# Patient Record
Sex: Female | Born: 1990 | Race: Black or African American | Hispanic: No | Marital: Single | Smoking: Never smoker
Health system: Southern US, Community
[De-identification: ages and names within clinical notes are randomized; demographics above are authoritative.]

## PROBLEM LIST (undated history)

## (undated) DIAGNOSIS — E559 Vitamin D deficiency, unspecified: Secondary | ICD-10-CM

## (undated) DIAGNOSIS — N83201 Unspecified ovarian cyst, right side: Secondary | ICD-10-CM

## (undated) HISTORY — PX: NO PAST SURGERIES: SHX2092

## (undated) HISTORY — DX: Vitamin D deficiency, unspecified: E55.9

## (undated) HISTORY — DX: Unspecified ovarian cyst, right side: N83.201

---

## 2019-02-12 ENCOUNTER — Ambulatory Visit: Payer: Managed Care, Other (non HMO) | Attending: Internal Medicine

## 2019-02-12 DIAGNOSIS — Z20822 Contact with and (suspected) exposure to covid-19: Secondary | ICD-10-CM

## 2019-02-13 ENCOUNTER — Other Ambulatory Visit: Payer: Self-pay

## 2019-02-13 LAB — NOVEL CORONAVIRUS, NAA: SARS-CoV-2, NAA: NOT DETECTED

## 2019-02-22 ENCOUNTER — Ambulatory Visit: Payer: Managed Care, Other (non HMO) | Attending: Internal Medicine

## 2019-02-22 DIAGNOSIS — Z20822 Contact with and (suspected) exposure to covid-19: Secondary | ICD-10-CM

## 2019-02-24 LAB — NOVEL CORONAVIRUS, NAA: SARS-CoV-2, NAA: NOT DETECTED

## 2019-10-08 ENCOUNTER — Other Ambulatory Visit: Payer: Managed Care, Other (non HMO)

## 2020-07-03 ENCOUNTER — Other Ambulatory Visit: Payer: Self-pay

## 2020-07-03 ENCOUNTER — Emergency Department (HOSPITAL_BASED_OUTPATIENT_CLINIC_OR_DEPARTMENT_OTHER)
Admission: EM | Admit: 2020-07-03 | Discharge: 2020-07-03 | Disposition: A | Discharge status: Home / Self Care | Payer: BLUE CROSS/BLUE SHIELD | Attending: Emergency Medicine | Admitting: Emergency Medicine

## 2020-07-03 ENCOUNTER — Encounter (HOSPITAL_BASED_OUTPATIENT_CLINIC_OR_DEPARTMENT_OTHER): Payer: Self-pay

## 2020-07-03 ENCOUNTER — Emergency Department (HOSPITAL_BASED_OUTPATIENT_CLINIC_OR_DEPARTMENT_OTHER): Payer: BLUE CROSS/BLUE SHIELD

## 2020-07-03 DIAGNOSIS — R102 Pelvic and perineal pain: Secondary | ICD-10-CM

## 2020-07-03 DIAGNOSIS — N83201 Unspecified ovarian cyst, right side: Secondary | ICD-10-CM | POA: Diagnosis not present

## 2020-07-03 DIAGNOSIS — R1030 Lower abdominal pain, unspecified: Secondary | ICD-10-CM

## 2020-07-03 DIAGNOSIS — N83202 Unspecified ovarian cyst, left side: Secondary | ICD-10-CM | POA: Insufficient documentation

## 2020-07-03 LAB — CBC WITH DIFFERENTIAL/PLATELET
Abs Immature Granulocytes: 0.02 10*3/uL (ref 0.00–0.07)
Basophils Absolute: 0 10*3/uL (ref 0.0–0.1)
Basophils Relative: 0 %
Eosinophils Absolute: 0.1 10*3/uL (ref 0.0–0.5)
Eosinophils Relative: 1 %
HCT: 38 % (ref 36.0–46.0)
Hemoglobin: 12.7 g/dL (ref 12.0–15.0)
Immature Granulocytes: 0 %
Lymphocytes Relative: 31 %
Lymphs Abs: 2.2 10*3/uL (ref 0.7–4.0)
MCH: 30 pg (ref 26.0–34.0)
MCHC: 33.4 g/dL (ref 30.0–36.0)
MCV: 89.6 fL (ref 80.0–100.0)
Monocytes Absolute: 0.3 10*3/uL (ref 0.1–1.0)
Monocytes Relative: 4 %
Neutro Abs: 4.5 10*3/uL (ref 1.7–7.7)
Neutrophils Relative %: 64 %
Platelets: 242 10*3/uL (ref 150–400)
RBC: 4.24 MIL/uL (ref 3.87–5.11)
RDW: 12.4 % (ref 11.5–15.5)
WBC: 7.1 10*3/uL (ref 4.0–10.5)
nRBC: 0 % (ref 0.0–0.2)

## 2020-07-03 LAB — URINALYSIS, ROUTINE W REFLEX MICROSCOPIC
Bilirubin Urine: NEGATIVE
Glucose, UA: NEGATIVE mg/dL
Hgb urine dipstick: NEGATIVE
Ketones, ur: NEGATIVE mg/dL
Leukocytes,Ua: NEGATIVE
Nitrite: NEGATIVE
Protein, ur: NEGATIVE mg/dL
Specific Gravity, Urine: 1.01 (ref 1.005–1.030)
pH: 7 (ref 5.0–8.0)

## 2020-07-03 LAB — COMPREHENSIVE METABOLIC PANEL
ALT: 9 U/L (ref 0–44)
AST: 19 U/L (ref 15–41)
Albumin: 4.4 g/dL (ref 3.5–5.0)
Alkaline Phosphatase: 34 U/L — ABNORMAL LOW (ref 38–126)
Anion gap: 9 (ref 5–15)
BUN: 9 mg/dL (ref 6–20)
CO2: 24 mmol/L (ref 22–32)
Calcium: 8.9 mg/dL (ref 8.9–10.3)
Chloride: 104 mmol/L (ref 98–111)
Creatinine, Ser: 0.61 mg/dL (ref 0.44–1.00)
GFR, Estimated: >60 mL/min (ref >60)
Glucose, Bld: 103 mg/dL — ABNORMAL HIGH (ref 70–99)
Potassium: 3.6 mmol/L (ref 3.5–5.1)
Sodium: 137 mmol/L (ref 135–145)
Total Bilirubin: 1.5 mg/dL — ABNORMAL HIGH (ref 0.3–1.2)
Total Protein: 8.2 g/dL — ABNORMAL HIGH (ref 6.5–8.1)

## 2020-07-03 LAB — PREGNANCY, URINE: Preg Test, Ur: NEGATIVE

## 2020-07-03 MED ORDER — NAPROXEN 500 MG PO TABS: 500.0000 mg | ORAL_TABLET | Freq: Two times a day (BID) | ORAL | 0 refills | Status: DC

## 2020-07-03 MED ORDER — KETOROLAC TROMETHAMINE 30 MG/ML IJ SOLN
30.0000 mg | Freq: Once | INTRAMUSCULAR | Status: AC
  Administered 2020-07-03: 30 mg via INTRAVENOUS
  Filled 2020-07-03: qty 1

## 2020-07-03 NOTE — ED Provider Notes (Signed)
[facility] EMERGENCY DEPARTMENT Provider Note   CSN: [id number] Arrival date & time: 07/03/20  1417     History Chief Complaint  Patient presents with  . Abdominal Pain    Monique Myers is a 30 y.o. female.  Patient presents emergency department for evaluation of intermittent sharp right lower and middle lower abdominal pains.  Symptoms started about 1 AM.  Patient applied a heating pad and then fell asleep.  Pain was gone when she woke up and she went to work.  Pain returned.  She states that she is expecting her period to start.  Pain will last approximately 10 to 15 minutes during these episodes and then improved.  No associated nausea, vomiting, diarrhea.  No constipation. No hematuria or irritative UTI symptoms including dysuria, increased frequency or urgency.  No vaginal discharge but did just noted a small amount of spotting when she went to provide a urine sample here.  She is not currently sexually active (last encounter > 1 year ago) and is not concerned about pregnancy or sexually transmitted infection.  No history of abdominal surgeries.  No other treatments prior to arrival.        History reviewed. No pertinent past medical history.  There are no problems to display for this patient.   History reviewed. No pertinent surgical history.   OB History   No obstetric history on file.     History reviewed. No pertinent family history.  Social History   Tobacco Use  . Smoking status: Never Smoker  . Smokeless tobacco: Never Used  Vaping Use  . Vaping Use: Never used  Substance Use Topics  . Alcohol use: Yes    Comment: ocassional  . Drug use: Never    Home Medications Prior to Admission medications   Medication Sig Start Date End Date Taking? Authorizing Provider  cetirizine (ZYRTEC) 10 MG tablet Take by mouth.    [provider]    Allergies    Other  Review of Systems   Review of Systems  Constitutional: Negative for fever.   HENT: Negative for rhinorrhea and sore throat.   Eyes: Negative for redness.  Respiratory: Negative for cough.   Cardiovascular: Negative for chest pain.  Gastrointestinal: Positive for abdominal pain. Negative for diarrhea, nausea and vomiting.  Genitourinary: Positive for vaginal bleeding (spotting). Negative for dysuria, frequency, hematuria, urgency and vaginal discharge.  Musculoskeletal: Negative for myalgias.  Skin: Negative for rash.  Neurological: Negative for headaches.    Physical Exam Updated Vital Signs BP (!) 147/106 (BP Location: Right Arm)   Pulse 74   Temp 98.8 F (37.1 C) (Oral)   Resp 18   Ht 5\' 4"  (1.626 m)   Wt 63.5 kg   LMP 06/05/2020   SpO2 100%   BMI 24.03 kg/m   Physical Exam Vitals and nursing note reviewed.  Constitutional:      General: She is not in acute distress.    Appearance: She is well-developed.  HENT:     Head: Normocephalic and atraumatic.     Right Ear: External ear normal.     Left Ear: External ear normal.     Nose: Nose normal.  Eyes:     Conjunctiva/sclera: Conjunctivae normal.  Cardiovascular:     Rate and Rhythm: Normal rate and regular rhythm.     Heart sounds: No murmur heard.   Pulmonary:     Effort: No respiratory distress.     Breath sounds: No wheezing, rhonchi or rales.  Abdominal:     Palpations: Abdomen is soft.     Tenderness: There is abdominal tenderness in the right lower quadrant and suprapubic area. There is no guarding or rebound.     Comments: On palpation, there is minimal suprapubic and right lower quadrant tenderness reported.  No rebound or guarding.  Musculoskeletal:     Cervical back: Normal range of motion and neck supple.     Right lower leg: No edema.     Left lower leg: No edema.  Skin:    General: Skin is warm and dry.     Findings: No rash.  Neurological:     General: No focal deficit present.     Mental Status: She is alert. Mental status is at baseline.     Motor: No weakness.   Psychiatric:        Mood and Affect: Mood normal.     ED Results / Procedures / Treatments   Labs (all labs ordered are listed, but only abnormal results are displayed) Labs Reviewed  COMPREHENSIVE METABOLIC PANEL - Abnormal; Notable for the following components:      Result Value   Glucose, Bld 103 (*)    Total Protein 8.2 (*)    Alkaline Phosphatase 34 (*)    Total Bilirubin 1.5 (*)    All other components within normal limits  URINALYSIS, ROUTINE W REFLEX MICROSCOPIC  PREGNANCY, URINE  CBC WITH DIFFERENTIAL/PLATELET    EKG None  Radiology US PELVIC COMPLETE WITH TRANSVAGINAL  Result Date: 07/03/2020 CLINICAL DATA:  Pelvic pain and cramping EXAM: TRANSABDOMINAL AND TRANSVAGINAL ULTRASOUND OF PELVIS TECHNIQUE: Both transabdominal and transvaginal ultrasound examinations of the pelvis were performed. Transabdominal technique was performed for global imaging of the pelvis including uterus, ovaries, adnexal regions, and pelvic cul-de-sac. It was necessary to proceed with endovaginal exam following the transabdominal exam to visualize the endometrium and ovaries. COMPARISON:  None FINDINGS: Uterus Measurements: 7.4 x 4.0 x 4.5 cm = volume: 69.7 mL. No fibroids or other mass visualized. Endometrium Thickness: 9 mm.  No focal abnormality visualized. Right ovary Measurements: 5.3 x 4.0 x 5.0 cm = volume: 55.4 mL. Contains a 4.4 x 3.8 x 4.1 cm cyst with multiple thin septations. Left ovary Measurements: 4.1 x 2.6 x 3.6 cm = volume: 20.9 mL. Contains a 3.2 x 1.9 x 2.9 cm somewhat complicated cyst. Other findings A small amount of free fluid is seen in the pelvis. IMPRESSION: 1. Bilateral complicated ovarian cysts measuring 4.4 cm on the right with multiple thin septations and 3.2 cm on the left. Recommend a follow-up ultrasound in 6-12 weeks to ensure resolution. There is a small amount of physiologic fluid in the pelvis. 2. No other abnormalities. Electronically Signed   By: Gerome Sam III  M.D   On: 07/03/2020 16:51    Procedures Procedures   Medications Ordered in ED Medications  ketorolac (TORADOL) 30 MG/ML injection 30 mg (30 mg Intravenous Given 07/03/20 1559)    ED Course  I have reviewed the triage vital signs and the nursing notes.  Pertinent labs & imaging results that were available during my care of the patient were reviewed by me and considered in my medical decision making (see chart for details).  Patient seen and examined. Work-up initiated. Symptoms minimal and non-specific at this point.  Intermittent nature makes appendicitis less likely.  History makes PID less likely, no vaginal discharge.  Will obtain basic labs and UA.  Discussed that if these tests look good, option would  be for pelvic ultrasound versus treatment and watchful waiting.  She did ask about CT imaging.  Discussed that at this point I think the risk of a CT scan would outweigh the benefit given that probability of appendicitis and other emergent, surgical pathology of abdominal pain are very low.  Vital signs reviewed and are as follows: BP (!) 147/106 (BP Location: Right Arm)   Pulse 74   Temp 98.8 F (37.1 C) (Oral)   Resp 18   Ht 5\' 4"  (1.626 m)   Wt 63.5 kg   LMP 06/05/2020   SpO2 100%   BMI 24.03 kg/m   3:56 PM patient updated on reassuring results.  She states that she has had more persistent pain since around 3:16 PM.  On reexam, when I press in the suprapubic area, she tells me that she feels more pain in the left lower quadrant now and she has pain that moves to the right lower quadrant.  Offered pelvic ultrasound, she agrees to proceed.  We will give 30 mg of Toradol IV and reassess.  She appears comfortable.  5:42 PM ultrasound shows bilateral ovarian cysts, slightly greater on the right, with small amount of free fluid in the pelvis.  I think that this is likely the cause of the patient's pain.  Discussed results at bedside with her.  Discussed that we cannot entirely rule  out other intra-abdominal etiology, but do not feel that CT imaging is indicated today.  She is in agreement.  She will follow-up with her OB/GYN regarding ovarian cyst.  Encouraged NSAIDs for pain.   The patient was urged to return to the Emergency Department immediately with worsening of current symptoms, worsening abdominal pain, persistent vomiting, blood noted in stools, fever, or any other concerns. The patient verbalized understanding.     MDM Rules/Calculators/A&P                          Patient with abdominal pain, lower abd, non-focal. Vitals are stable, no fever. Labs overall reassuring with normal WBC. Imaging 06/07/2020 with bilateral ovarian cysts. No signs of dehydration, patient is tolerating PO's. Lungs are clear and no signs suggestive of PNA. Low concern for appendicitis, cholecystitis, pancreatitis, ruptured viscus, UTI, kidney stone, aortic dissection, aortic aneurysm or other emergent abdominal etiology. Supportive therapy indicated with return if symptoms worsen.   Final Clinical Impression(s) / ED Diagnoses Final diagnoses:  Bilateral ovarian cysts  Lower abdominal pain    Rx / DC Orders ED Discharge Orders         Ordered    naproxen (NAPROSYN) 500 MG tablet  2 times daily        07/03/20 1743           09/02/20, PA-C 07/03/20 1747    09/02/20, MD 07/16/20 0830

## 2020-07-03 NOTE — ED Triage Notes (Signed)
Pt c/o intermittent right lower abdominal pain radiating to pelvis, sharp in nature. Denies N/V/D or urinary symptoms

## 2020-07-03 NOTE — Discharge Instructions (Signed)
Please read and follow all provided instructions.  Your diagnoses today include:  1. Bilateral ovarian cysts   2. Pelvic pain   3. Lower abdominal pain     Tests performed today include:  Blood cell counts and platelets  Kidney and liver function tests  Urine test to look for infection  A blood or urine test for pregnancy (women only)  Ultrasound - shows ovarian cysts and a small amount of fluid in the pelvis  Vital signs. See below for your results today.   Medications prescribed:   Naproxen - anti-inflammatory pain medication  Do not exceed 500mg  naproxen every 12 hours, take with food  You have been prescribed an anti-inflammatory medication or NSAID. Take with food. Take smallest effective dose for the shortest duration needed for your pain. Stop taking if you experience stomach pain or vomiting.   Take any prescribed medications only as directed.  Home care instructions:   Follow any educational materials contained in this packet.  Follow-up instructions: Please follow-up with your primary care provider in the next 7 days for further evaluation of your symptoms.    Return instructions:  SEEK IMMEDIATE MEDICAL ATTENTION IF:  The pain does not go away or becomes severe   A temperature above 101F develops   Repeated vomiting occurs (multiple episodes)   The pain becomes localized to portions of the abdomen. The right side could possibly be appendicitis. In an adult, the left lower portion of the abdomen could be colitis or diverticulitis.   Blood is being passed in stools or vomit (bright red or black tarry stools)   You develop chest pain, difficulty breathing, dizziness or fainting, or become confused, poorly responsive, or inconsolable (young children)  If you have any other emergent concerns regarding your health  Additional Information: Abdominal (belly) pain can be caused by many things. Your caregiver performed an examination and possibly ordered  blood/urine tests and imaging (CT scan, x-rays, ultrasound). Many cases can be observed and treated at home after initial evaluation in the emergency department. Even though you are being discharged home, abdominal pain can be unpredictable. Therefore, you need a repeated exam if your pain does not resolve, returns, or worsens. Most patients with abdominal pain don't have to be admitted to the hospital or have surgery, but serious problems like appendicitis and gallbladder attacks can start out as nonspecific pain. Many abdominal conditions cannot be diagnosed in one visit, so follow-up evaluations are very important.  Your vital signs today were: BP 116/88   Pulse 66   Temp 98.5 F (36.9 C) (Oral)   Resp 16   Ht 5\' 4"  (1.626 m)   Wt 63.5 kg   LMP 06/05/2020   SpO2 100%   BMI 24.03 kg/m  If your blood pressure (bp) was elevated above 135/85 this visit, please have this repeated by your doctor within one month. --------------

## 2022-02-22 NOTE — L&D Delivery Note (Addendum)
Delivery Note At 9:38 AM a viable female was delivered via  (Presentation: ROA).  APGAR: 9, 9; weight  pending.   Placenta status:  spont,  intact.  Cord:  3V with the following complications:  none.  Cord pH: n/a  Anesthesia: None Episiotomy:  None Lacerations:  Bilateral labial Suture Repair: 3.0 vicryl Est. Blood Loss (mL):  233  Mom to postpartum.  Baby to Couplet care / Skin to Skin.  Purcell Nails 01/25/2023, 10:00 AM

## 2022-07-31 IMAGING — US US PELVIS COMPLETE WITH TRANSVAGINAL
1 series · 13 of 25 positions shown · non-contrast
Comparison: None

CLINICAL DATA: Pelvic pain and cramping

EXAM:
TRANSABDOMINAL AND TRANSVAGINAL ULTRASOUND OF PELVIS
TECHNIQUE: Both transabdominal and transvaginal ultrasound examinations of the
pelvis were performed. Transabdominal technique was performed for
global imaging of the pelvis including uterus, ovaries, adnexal
regions, and pelvic cul-de-sac. It was necessary to proceed with
endovaginal exam following the transabdominal exam to visualize the
endometrium and ovaries.

[Series 1: us pelvis complete with transvaginal · 13 of 74 slices shown]
[im 1/74]
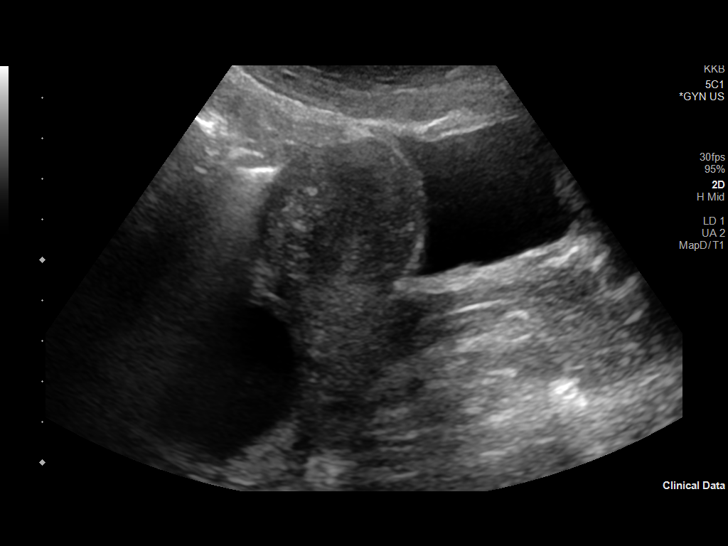
[im 7/74]
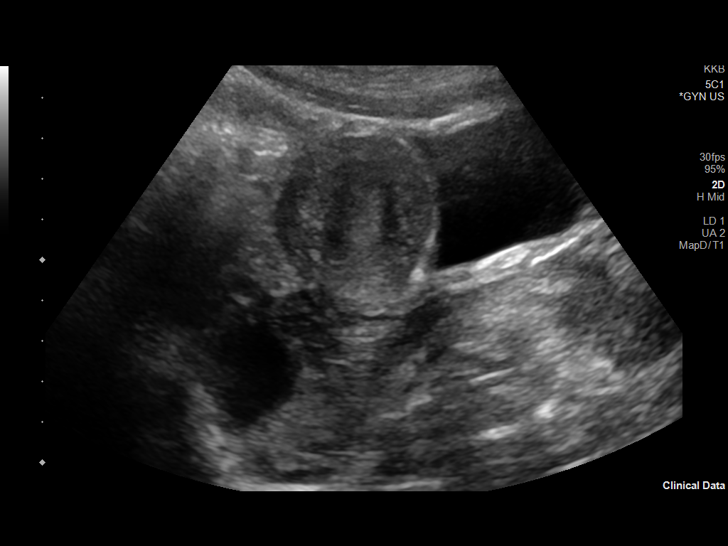
[im 13/74]
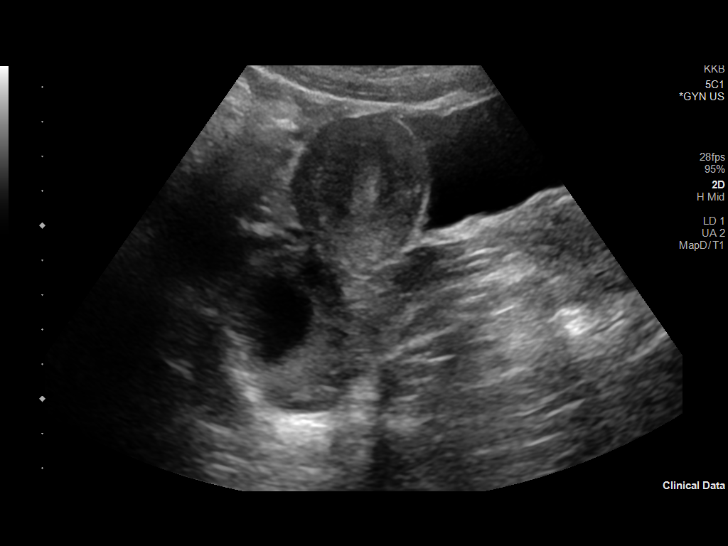
[im 19/74]
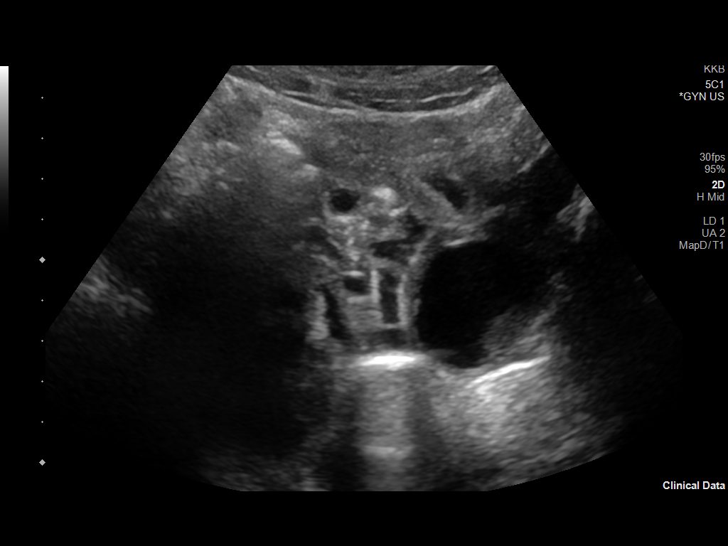
[im 25/74]
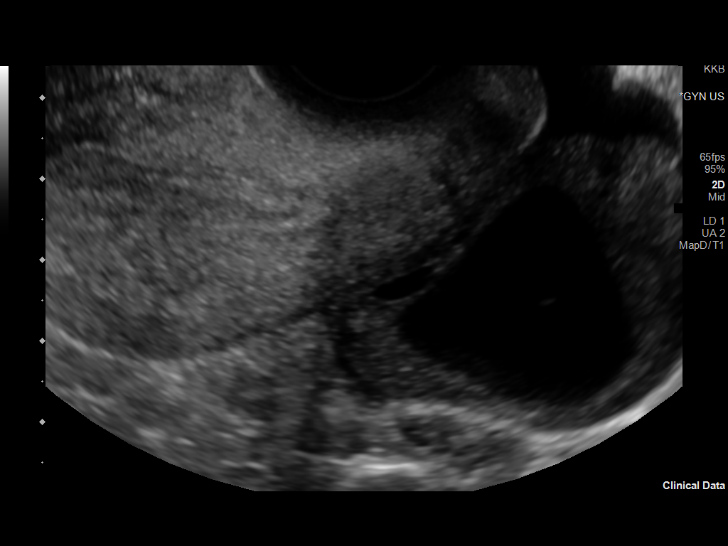
[im 31/74]
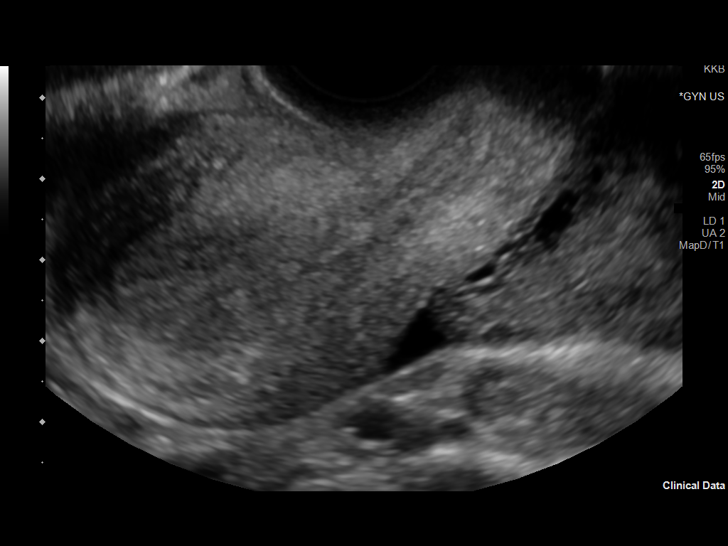
[im 37/74]
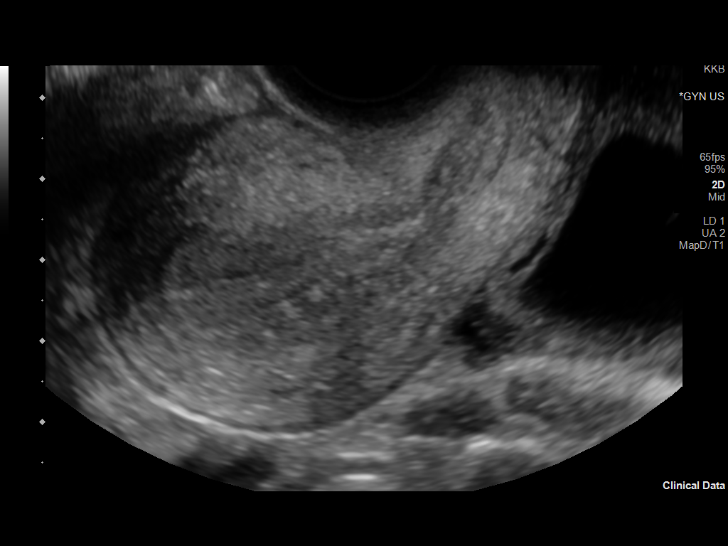
[im 43/74]
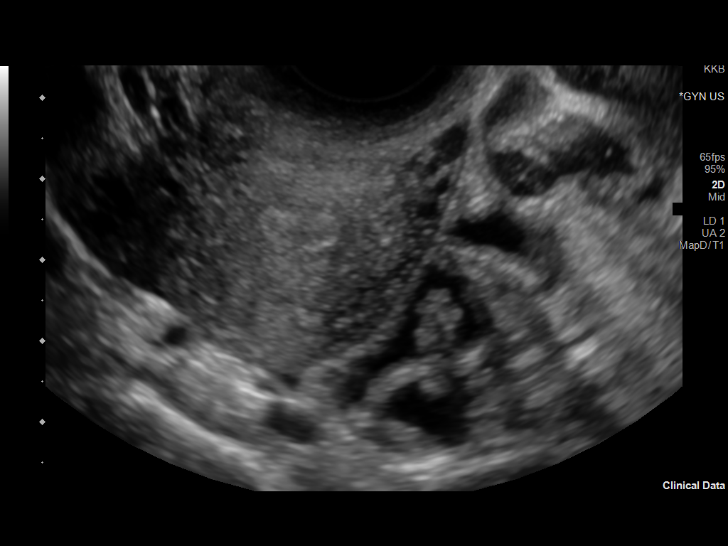
[im 49/74]
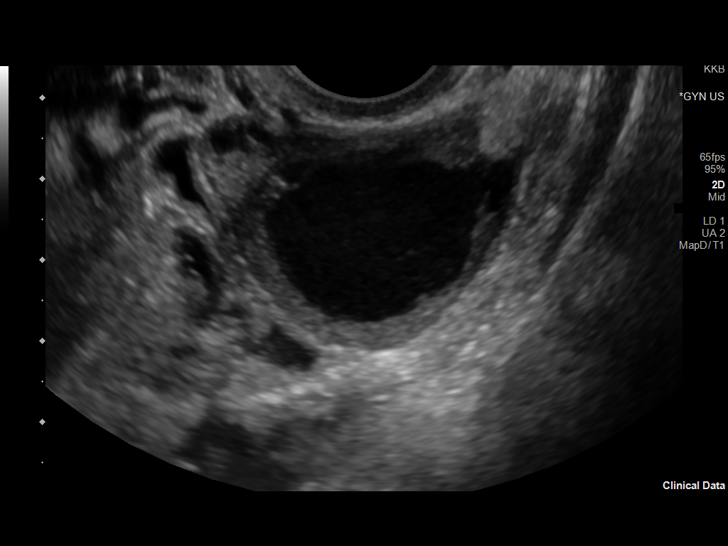
[im 55/74]
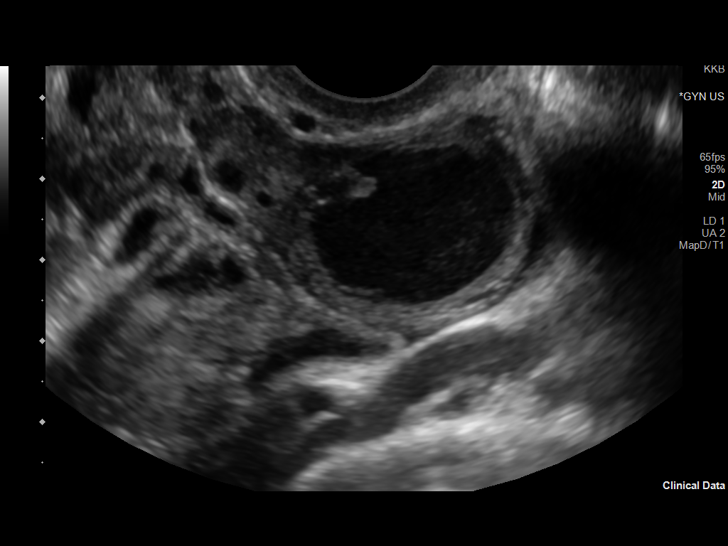
[im 61/74]
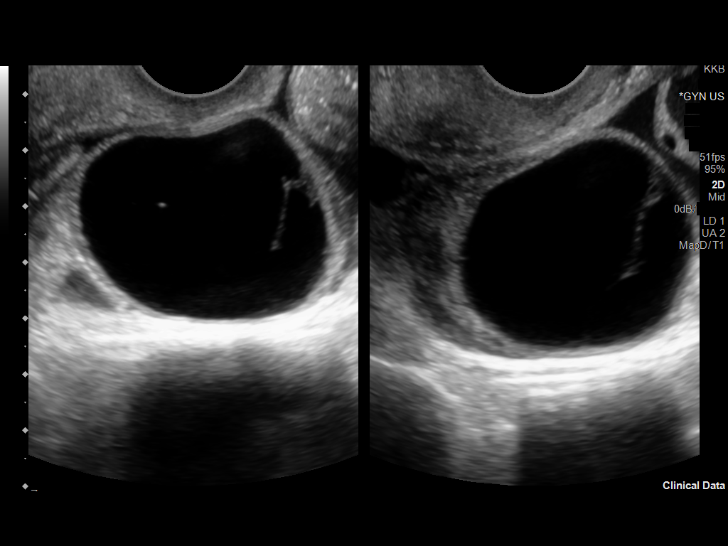
[im 67/74]
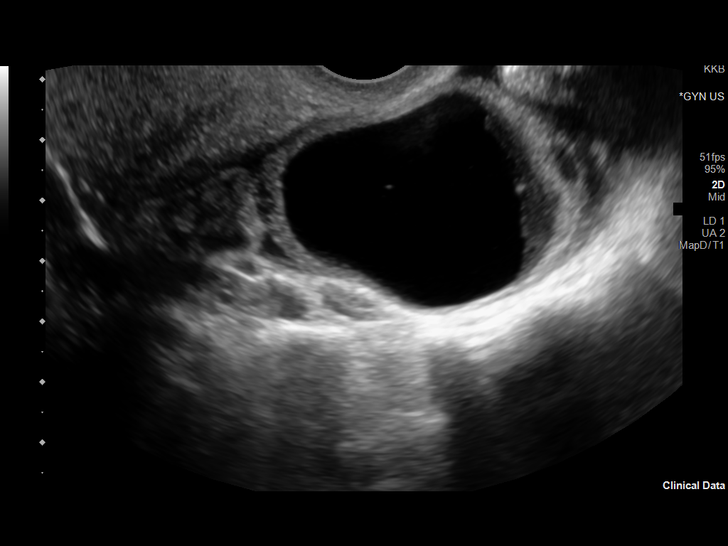
[im 74/74]
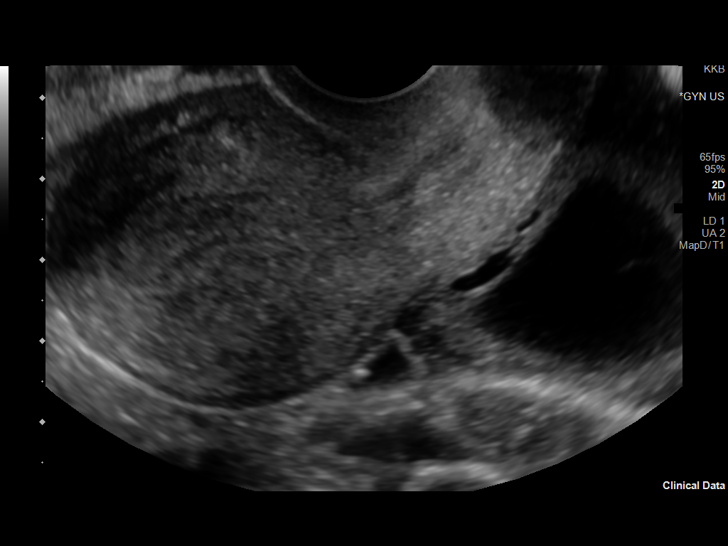

[13 of 25 positions shown; findings below may reference images not displayed]

FINDINGS: Uterus

Measurements: 7.4 x 4.0 x 4.5 cm = volume: 69.7 mL. No fibroids or
other mass visualized.

Endometrium

Thickness: 9 mm.  No focal abnormality visualized.

Right ovary

Measurements: 5.3 x 4.0 x 5.0 cm = volume: 55.4 mL. Contains a 4.4 x
3.8 x 4.1 cm cyst with multiple thin septations.

Left ovary

Measurements: 4.1 x 2.6 x 3.6 cm = volume: 20.9 mL. Contains a 3.2 x
1.9 x 2.9 cm somewhat complicated cyst.

Other findings

A small amount of free fluid is seen in the pelvis.
IMPRESSION: 1. Bilateral complicated ovarian cysts measuring 4.4 cm on the right
with multiple thin septations and 3.2 cm on the left. Recommend a
follow-up ultrasound in 6-12 weeks to ensure resolution. There is a
small amount of physiologic fluid in the pelvis.
2. No other abnormalities.

## 2022-08-25 LAB — OB RESULTS CONSOLE GC/CHLAMYDIA
Chlamydia: NEGATIVE
Neisseria Gonorrhea: NEGATIVE

## 2022-08-25 LAB — OB RESULTS CONSOLE RPR: RPR: NONREACTIVE

## 2022-08-25 LAB — OB RESULTS CONSOLE RUBELLA ANTIBODY, IGM: Rubella: IMMUNE

## 2022-08-25 LAB — OB RESULTS CONSOLE HIV ANTIBODY (ROUTINE TESTING): HIV: NONREACTIVE

## 2022-08-25 LAB — OB RESULTS CONSOLE HEPATITIS B SURFACE ANTIGEN: Hepatitis B Surface Ag: NEGATIVE

## 2022-09-06 ENCOUNTER — Other Ambulatory Visit: Payer: Self-pay | Admitting: Obstetrics and Gynecology

## 2022-09-06 DIAGNOSIS — Z363 Encounter for antenatal screening for malformations: Secondary | ICD-10-CM

## 2022-09-24 ENCOUNTER — Encounter: Payer: Self-pay | Admitting: *Deleted

## 2022-09-24 DIAGNOSIS — O0930 Supervision of pregnancy with insufficient antenatal care, unspecified trimester: Secondary | ICD-10-CM | POA: Insufficient documentation

## 2022-09-29 ENCOUNTER — Encounter: Payer: Self-pay | Admitting: *Deleted

## 2022-09-29 ENCOUNTER — Ambulatory Visit: Payer: Medicaid Other

## 2022-09-29 ENCOUNTER — Ambulatory Visit: Payer: Medicaid Other | Attending: Obstetrics and Gynecology

## 2022-09-29 VITALS — BP 110/72 | HR 78

## 2022-09-29 DIAGNOSIS — Z363 Encounter for antenatal screening for malformations: Secondary | ICD-10-CM | POA: Diagnosis present

## 2022-09-29 DIAGNOSIS — O0930 Supervision of pregnancy with insufficient antenatal care, unspecified trimester: Secondary | ICD-10-CM | POA: Diagnosis present

## 2022-11-16 LAB — HEPATITIS C ANTIBODY: HCV Ab: NEGATIVE

## 2022-11-16 LAB — OB RESULTS CONSOLE HIV ANTIBODY (ROUTINE TESTING): HIV: NONREACTIVE

## 2023-01-06 LAB — OB RESULTS CONSOLE GBS: GBS: NEGATIVE

## 2023-01-24 ENCOUNTER — Other Ambulatory Visit: Payer: Self-pay

## 2023-01-24 ENCOUNTER — Encounter (HOSPITAL_COMMUNITY): Payer: Self-pay | Admitting: Obstetrics and Gynecology

## 2023-01-24 ENCOUNTER — Inpatient Hospital Stay (HOSPITAL_COMMUNITY)
Admission: AD | Admit: 2023-01-24 | Discharge: 2023-01-27 | DRG: 806 | Disposition: A | Discharge status: Home / Self Care | Payer: Medicaid Other | Attending: Obstetrics and Gynecology | Admitting: Obstetrics and Gynecology

## 2023-01-24 DIAGNOSIS — D509 Iron deficiency anemia, unspecified: Secondary | ICD-10-CM | POA: Diagnosis present

## 2023-01-24 DIAGNOSIS — R030 Elevated blood-pressure reading, without diagnosis of hypertension: Principal | ICD-10-CM

## 2023-01-24 DIAGNOSIS — O9912 Other diseases of the blood and blood-forming organs and certain disorders involving the immune mechanism complicating childbirth: Secondary | ICD-10-CM | POA: Diagnosis present

## 2023-01-24 DIAGNOSIS — D6959 Other secondary thrombocytopenia: Secondary | ICD-10-CM | POA: Diagnosis present

## 2023-01-24 DIAGNOSIS — O26893 Other specified pregnancy related conditions, third trimester: Secondary | ICD-10-CM

## 2023-01-24 DIAGNOSIS — Z825 Family history of asthma and other chronic lower respiratory diseases: Secondary | ICD-10-CM

## 2023-01-24 DIAGNOSIS — Z3A38 38 weeks gestation of pregnancy: Secondary | ICD-10-CM | POA: Diagnosis not present

## 2023-01-24 DIAGNOSIS — O9902 Anemia complicating childbirth: Secondary | ICD-10-CM | POA: Diagnosis present

## 2023-01-24 DIAGNOSIS — O134 Gestational [pregnancy-induced] hypertension without significant proteinuria, complicating childbirth: Secondary | ICD-10-CM | POA: Diagnosis present

## 2023-01-24 DIAGNOSIS — Z8249 Family history of ischemic heart disease and other diseases of the circulatory system: Secondary | ICD-10-CM

## 2023-01-24 DIAGNOSIS — O163 Unspecified maternal hypertension, third trimester: Secondary | ICD-10-CM | POA: Diagnosis present

## 2023-01-24 DIAGNOSIS — O139 Gestational [pregnancy-induced] hypertension without significant proteinuria, unspecified trimester: Secondary | ICD-10-CM | POA: Diagnosis present

## 2023-01-24 LAB — COMPREHENSIVE METABOLIC PANEL
ALT: 15 U/L (ref 0–44)
AST: 28 U/L (ref 15–41)
Albumin: 2.7 g/dL — ABNORMAL LOW (ref 3.5–5.0)
Alkaline Phosphatase: 122 U/L (ref 38–126)
Anion gap: 8 (ref 5–15)
BUN: <5 mg/dL — ABNORMAL LOW (ref 6–20)
CO2: 21 mmol/L — ABNORMAL LOW (ref 22–32)
Calcium: 8.5 mg/dL — ABNORMAL LOW (ref 8.9–10.3)
Chloride: 108 mmol/L (ref 98–111)
Creatinine, Ser: 0.74 mg/dL (ref 0.44–1.00)
GFR, Estimated: >60 mL/min (ref >60)
Glucose, Bld: 78 mg/dL (ref 70–99)
Potassium: 3.5 mmol/L (ref 3.5–5.1)
Sodium: 137 mmol/L (ref 135–145)
Total Bilirubin: 0.7 mg/dL (ref <1.2)
Total Protein: 6.1 g/dL — ABNORMAL LOW (ref 6.5–8.1)

## 2023-01-24 LAB — CBC
HCT: 28.8 % — ABNORMAL LOW (ref 36.0–46.0)
Hemoglobin: 9.7 g/dL — ABNORMAL LOW (ref 12.0–15.0)
MCH: 30.8 pg (ref 26.0–34.0)
MCHC: 33.7 g/dL (ref 30.0–36.0)
MCV: 91.4 fL (ref 80.0–100.0)
Platelets: 132 10*3/uL — ABNORMAL LOW (ref 150–400)
RBC: 3.15 MIL/uL — ABNORMAL LOW (ref 3.87–5.11)
RDW: 12.4 % (ref 11.5–15.5)
WBC: 6.9 10*3/uL (ref 4.0–10.5)
nRBC: 0 % (ref 0.0–0.2)

## 2023-01-24 LAB — TYPE AND SCREEN
ABO/RH(D): A POS
Antibody Screen: NEGATIVE

## 2023-01-24 LAB — PROTEIN / CREATININE RATIO, URINE
Creatinine, Urine: 43 mg/dL
Total Protein, Urine: <6 mg/dL

## 2023-01-24 MED ORDER — OXYTOCIN-SODIUM CHLORIDE 30-0.9 UT/500ML-% IV SOLN
1.0000 m[IU]/min | INTRAVENOUS | Status: DC
  Administered 2023-01-24: 2 m[IU]/min via INTRAVENOUS
  Filled 2023-01-24: qty 500

## 2023-01-24 MED ORDER — LABETALOL HCL 5 MG/ML IV SOLN: 20.0000 mg | INTRAVENOUS | Status: DC | PRN

## 2023-01-24 MED ORDER — LACTATED RINGERS IV SOLN: 500.0000 mL | Freq: Once | INTRAVENOUS | Status: DC

## 2023-01-24 MED ORDER — EPHEDRINE 5 MG/ML INJ: 10.0000 mg | INTRAVENOUS | Status: DC | PRN

## 2023-01-24 MED ORDER — ONDANSETRON HCL 4 MG/2ML IJ SOLN: 4.0000 mg | Freq: Four times a day (QID) | INTRAMUSCULAR | Status: DC | PRN

## 2023-01-24 MED ORDER — OXYTOCIN BOLUS FROM INFUSION
333.0000 mL | Freq: Once | INTRAVENOUS | Status: AC
  Administered 2023-01-25: 333 mL via INTRAVENOUS

## 2023-01-24 MED ORDER — HYDRALAZINE HCL 20 MG/ML IJ SOLN: 10.0000 mg | INTRAMUSCULAR | Status: DC | PRN

## 2023-01-24 MED ORDER — FENTANYL CITRATE (PF) 100 MCG/2ML IJ SOLN
50.0000 ug | INTRAMUSCULAR | Status: DC | PRN
  Administered 2023-01-24 (×2): 100 ug via INTRAVENOUS
  Administered 2023-01-24: 50 ug via INTRAVENOUS
  Filled 2023-01-24 (×3): qty 2

## 2023-01-24 MED ORDER — PHENYLEPHRINE 80 MCG/ML (10ML) SYRINGE FOR IV PUSH (FOR BLOOD PRESSURE SUPPORT): 80.0000 ug | PREFILLED_SYRINGE | INTRAVENOUS | Status: DC | PRN

## 2023-01-24 MED ORDER — SOD CITRATE-CITRIC ACID 500-334 MG/5ML PO SOLN: 30.0000 mL | ORAL | Status: DC | PRN

## 2023-01-24 MED ORDER — FENTANYL-BUPIVACAINE-NACL 0.5-0.125-0.9 MG/250ML-% EP SOLN
12.0000 mL/h | EPIDURAL | Status: DC | PRN
  Filled 2023-01-24: qty 250

## 2023-01-24 MED ORDER — LABETALOL HCL 5 MG/ML IV SOLN: 40.0000 mg | INTRAVENOUS | Status: DC | PRN

## 2023-01-24 MED ORDER — ACETAMINOPHEN 325 MG PO TABS: 650.0000 mg | ORAL_TABLET | ORAL | Status: DC | PRN

## 2023-01-24 MED ORDER — DIPHENHYDRAMINE HCL 50 MG/ML IJ SOLN: 12.5000 mg | INTRAMUSCULAR | Status: DC | PRN

## 2023-01-24 MED ORDER — LABETALOL HCL 5 MG/ML IV SOLN: 80.0000 mg | INTRAVENOUS | Status: DC | PRN

## 2023-01-24 MED ORDER — LACTATED RINGERS IV SOLN: INTRAVENOUS | Status: DC

## 2023-01-24 MED ORDER — OXYCODONE-ACETAMINOPHEN 5-325 MG PO TABS: 2.0000 | ORAL_TABLET | ORAL | Status: DC | PRN

## 2023-01-24 MED ORDER — OXYCODONE-ACETAMINOPHEN 5-325 MG PO TABS: 1.0000 | ORAL_TABLET | ORAL | Status: DC | PRN

## 2023-01-24 MED ORDER — TERBUTALINE SULFATE 1 MG/ML IJ SOLN: 0.2500 mg | Freq: Once | INTRAMUSCULAR | Status: DC | PRN

## 2023-01-24 MED ORDER — LIDOCAINE HCL (PF) 1 % IJ SOLN
30.0000 mL | INTRAMUSCULAR | Status: AC | PRN
  Administered 2023-01-25: 30 mL via SUBCUTANEOUS
  Filled 2023-01-24: qty 30

## 2023-01-24 MED ORDER — OXYTOCIN-SODIUM CHLORIDE 30-0.9 UT/500ML-% IV SOLN: 2.5000 [IU]/h | INTRAVENOUS | Status: DC

## 2023-01-24 MED ORDER — LACTATED RINGERS IV SOLN: 500.0000 mL | INTRAVENOUS | Status: DC | PRN

## 2023-01-24 NOTE — Progress Notes (Signed)
Just spoke with the RN. Pt is comfortable BP (!) 139/91   Pulse 70   Temp 97.9 F (36.6 C) (Oral)   Resp 16   Ht 5\' 5"  (1.651 m)   Wt 73.1 kg   LMP 04/30/2022   SpO2 100%   BMI 26.81 kg/m  Cat 1 ( remote reading) Toco q 2-4 min Increase pitocin Recheck cbc in am Anticipate SVD

## 2023-01-24 NOTE — H&P (Signed)
Monique Myers is a 32 y.o. female presenting for contractions increasing in intensity and frequency.  She denies HA, blurred vision or RUQ pain. OB History     Gravida  1   Para      Term      Preterm      AB      Living         SAB      IAB      Ectopic      Multiple      Live Births             Past Medical History:  Diagnosis Date   Bilateral ovarian cysts    Vitamin D deficiency    Past Surgical History:  Procedure Laterality Date   NO PAST SURGERIES     Family History: family history includes Asthma in her mother; Hypertension in her maternal aunt and maternal grandmother. Social History:  reports that she has never smoked. She has never used smokeless tobacco. She reports that she does not currently use alcohol. She reports that she does not use drugs.     Maternal Diabetes: No Genetic Screening: Declined Maternal Ultrasounds/Referrals: Normal Fetal Ultrasounds or other Referrals:  None Maternal Substance Abuse:  No Significant Maternal Medications:  None Significant Maternal Lab Results:  Group B Strep negative Number of Prenatal Visits:greater than 3 verified prenatal visits Maternal Vaccinations:none Other Comments:  None  Review of Systems History Physical Examination: General appearance - alert, well appearing, and in no distress Chest - clear to auscultation, no wheezes, rales or rhonchi, symmetric air entry Heart - normal rate and regular rhythm Abdomen - soft, nontender, nondistended, no masses or organomegaly gravid Extremities - peripheral pulses normal, no pedal edema, no clubbing or cyanosis, Homan's sign negative bilaterally  Dilation: 2 Effacement (%): 70 Station: -1 Exam by:: Raelyn Mora, CNM Blood pressure (!) 131/91, pulse (!) 57, temperature 98.3 F (36.8 C), temperature source Oral, resp. rate 16, height 5\' 5"  (1.651 m), weight 73.1 kg, last menstrual period 04/30/2022, SpO2 100%. Exam Physical Exam  Prenatal  labs: ABO, Rh: --/--/A POS (12/02 1056) Antibody: NEG (12/02 1056) Rubella: Immune (07/03 0000) RPR: Nonreactive (07/03 0000)  HBsAg: Negative (07/03 0000)  HIV: Non-reactive (07/03 0000)  GBS:   neg CBC    Component Value Date/Time   WBC 6.9 01/24/2023 1101   RBC 3.15 (L) 01/24/2023 1101   HGB 9.7 (L) 01/24/2023 1101   HCT 28.8 (L) 01/24/2023 1101   PLT 132 (L) 01/24/2023 1101   MCV 91.4 01/24/2023 1101   MCH 30.8 01/24/2023 1101   MCHC 33.7 01/24/2023 1101   RDW 12.4 01/24/2023 1101   LYMPHSABS 2.2 07/03/2020 1448   MONOABS 0.3 07/03/2020 1448   EOSABS 0.1 07/03/2020 1448   BASOSABS 0.0 07/03/2020 1448   CMP     Component Value Date/Time   NA 137 01/24/2023 1101   K 3.5 01/24/2023 1101   CL 108 01/24/2023 1101   CO2 21 (L) 01/24/2023 1101   GLUCOSE 78 01/24/2023 1101   BUN <5 (L) 01/24/2023 1101   CREATININE 0.74 01/24/2023 1101   CALCIUM 8.5 (L) 01/24/2023 1101   PROT 6.1 (L) 01/24/2023 1101   ALBUMIN 2.7 (L) 01/24/2023 1101   AST 28 01/24/2023 1101   ALT 15 01/24/2023 1101   ALKPHOS 122 01/24/2023 1101   BILITOT 0.7 01/24/2023 1101   GFRNONAA >60 01/24/2023 1101    Assessment/Plan: Term Pregnancy with GHTN Platelets are 132 will  monitor.  Pt is asymptomatic.   LFTS WNL Cat 1  Pitocin augmentation Anticipate SVD Pain mamgement per pts choice    Cassidey Barrales A Aurilla Coulibaly 01/24/2023, 3:31 PM

## 2023-01-24 NOTE — MAU Note (Signed)
.  Monique Myers is a 32 y.o. at [redacted]w[redacted]d here in MAU reporting: Intermittent lower abdominal cramping and light pink vaginal spotting throughout the night. She reports she is unsure if they are contractions but the pains she is experiencing is 5-10 minutes. Denies LOF. +FM.   GBS-. Has not had a cervical exam.  Onset of complaint: Today Pain score: 7/10 lower abdomen  Vitals:   01/24/23 1012  BP: (!) 143/92  Pulse: 63  Resp: 16  Temp: 98.4 F (36.9 C)  SpO2: 100%     FHT: 133 initial external Lab orders placed from triage: [facility] Labor Eval

## 2023-01-24 NOTE — MAU Provider Note (Signed)
History     CSN: [id number]  Arrival date and time: 01/24/23 1003   Event Date/Time   First Provider Initiated Contact with Patient 01/24/23 1041      Chief Complaint  Patient presents with   Contractions   Vaginal Bleeding   HPI [name] is a 32 y.o. year old G1P0 female at [redacted]w[redacted]d weeks gestation who presents to MAU reporting intermittent lower abdominal cramping and light pink vaginal spotting throughout the night. She is unsure if they are contractions, but the pain is coming every 5-10 mins. She denies LOF. She reports (+) FM. She receives [facility]. Her pregnancy has  has no complications at this time.  OB History     Gravida  1   Para      Term      Preterm      AB      Living         SAB      IAB      Ectopic      Multiple      Live Births              Past Medical History:  Diagnosis Date   Bilateral ovarian cysts    Vitamin D deficiency     Past Surgical History:  Procedure Laterality Date   NO PAST SURGERIES      Family History  Problem Relation Age of Onset   Asthma Mother    Hypertension Maternal Aunt    Hypertension Maternal Grandmother     Social History   Tobacco Use   Smoking status: Never   Smokeless tobacco: Never  Vaping Use   Vaping status: Never Used  Substance Use Topics   Alcohol use: Not Currently    Comment: ocassional   Drug use: Never    Allergies:  Allergies  Allergen Reactions   Other     Other reaction(s): Cough    Medications Prior to Admission  Medication Sig Dispense Refill Last Dose   cholecalciferol (VITAMIN D3) 25 MCG (1000 UNIT) tablet Take 1,000 Units by mouth daily.   Past Month   ferrous sulfate 325 (65 FE) MG tablet Take 325 mg by mouth daily with breakfast.   Past Month   Prenatal Vit-Fe Fumarate-FA (PRENATAL MULTIVITAMIN) TABS tablet Take 1 tablet by mouth daily at 12 noon.   Past Month   cetirizine (ZYRTEC) 10 MG tablet Take by mouth. (Patient not taking:  Reported on 09/29/2022)      naproxen (NAPROSYN) 500 MG tablet Take 1 tablet (500 mg total) by mouth 2 (two) times daily. 30 tablet 0     Review of Systems  Constitutional: Negative.   HENT: Negative.    Eyes: Negative.   Respiratory: Negative.    Cardiovascular: Negative.   Gastrointestinal: Negative.   Endocrine: Negative.   Genitourinary:  Positive for pelvic pain (contractions) and vaginal bleeding.  Musculoskeletal: Negative.   Skin: Negative.   Allergic/Immunologic: Negative.   Neurological: Negative.   Hematological: Negative.   Psychiatric/Behavioral: Negative.     Physical Exam  Patient Vitals for the past 24 hrs:  BP Temp Temp src Pulse Resp SpO2 Height Weight  01/24/23 1246 125/84 -- -- 60 -- -- -- --  01/24/23 1231 (!) 128/91 -- -- 71 -- -- -- --  01/24/23 1216 (!) 133/95 -- -- 77 -- -- -- --  01/24/23 1201 (!) 147/94 -- -- 71 -- -- -- --  01/24/23 1131 (!) 146/88 -- --  61 -- -- -- --  01/24/23 1116 (!) 135/90 -- -- 60 -- -- -- --  01/24/23 1102 (!) 143/90 -- -- 64 -- -- -- --  01/24/23 1040 (!) 144/94 -- -- 68 -- -- -- --  01/24/23 1012 (!) 143/92 98.4 F (36.9 C) Oral 63 16 100 % 5\' 5"  (1.651 m) 73.1 kg    Physical Exam Vitals and nursing note reviewed. Exam conducted with a chaperone present.  Constitutional:      Appearance: Normal appearance. She is normal weight.  Cardiovascular:     Rate and Rhythm: Normal rate.  Pulmonary:     Effort: Pulmonary effort is normal.  Abdominal:     Palpations: Abdomen is soft.  Genitourinary:    General: Normal vulva.     Comments: Dilation: 2 Effacement (%): 70 Station: -1 Presentation: Vertex, ballotable Exam by: Monique Myers, CNM  Musculoskeletal:        General: Normal range of motion.  Skin:    General: Skin is warm and dry.  Neurological:     Mental Status: She is alert and oriented to person, place, and time.  Psychiatric:        Mood and Affect: Mood normal.        Behavior: Behavior normal.         Thought Content: Thought content normal.        Judgment: Judgment normal.    REACTIVE NST - FHR: 135 bpm / moderate variability / accels present / decels absent / TOCO: irregular every 2-7 mins  MAU Course  Procedures  MDM CCUA CBC CMP P/C Ratio Serial BP's   Results for orders placed or performed during the hospital encounter of 01/24/23 (from the past 24 hour(s))  Comprehensive metabolic panel     Status: Abnormal   Collection Time: 01/24/23 11:01 AM  Result Value Ref Range   Sodium 137 135 - 145 mmol/L   Potassium 3.5 3.5 - 5.1 mmol/L   Chloride 108 98 - 111 mmol/L   CO2 21 (L) 22 - 32 mmol/L   Glucose, Bld 78 70 - 99 mg/dL   BUN <5 (L) 6 - 20 mg/dL   Creatinine, Ser 4.09 0.44 - 1.00 mg/dL   Calcium 8.5 (L) 8.9 - 10.3 mg/dL   Total Protein 6.1 (L) 6.5 - 8.1 g/dL   Albumin 2.7 (L) 3.5 - 5.0 g/dL   AST 28 15 - 41 U/L   ALT 15 0 - 44 U/L   Alkaline Phosphatase 122 38 - 126 U/L   Total Bilirubin 0.7 <1.2 mg/dL   GFR, Estimated >81 >19 mL/min   Anion gap 8 5 - 15  CBC     Status: Abnormal   Collection Time: 01/24/23 11:01 AM  Result Value Ref Range   WBC 6.9 4.0 - 10.5 K/uL   RBC 3.15 (L) 3.87 - 5.11 MIL/uL   Hemoglobin 9.7 (L) 12.0 - 15.0 g/dL   HCT 14.7 (L) 82.9 - 56.2 %   MCV 91.4 80.0 - 100.0 fL   MCH 30.8 26.0 - 34.0 pg   MCHC 33.7 30.0 - 36.0 g/dL   RDW 13.0 86.5 - 78.4 %   Platelets 132 (L) 150 - 400 K/uL   nRBC 0.0 0.0 - 0.2 %     *TC to Dr. Normand Sloop by Dr. Berton Lan @ 1250 - notified of patient's complaints, assessments, lab (pending) & NST results, tx plan admit to L&D for IOL for mild range elevated BPs - agrees with plan  Assessment and Plan   1. Elevated blood pressure reading in office without diagnosis of hypertension   2. [redacted] weeks gestation of pregnancy   - Admit to L&D  - Routine admission orders - Call Dr. Normand Sloop for IOL orders - H&P to be done by Dr. Normand Sloop or her representative  Monique Myers, CNM 01/24/2023, 10:41 AM

## 2023-01-25 ENCOUNTER — Encounter (HOSPITAL_COMMUNITY): Payer: Self-pay | Admitting: Obstetrics and Gynecology

## 2023-01-25 LAB — COMPREHENSIVE METABOLIC PANEL
ALT: 14 U/L (ref 0–44)
AST: 33 U/L (ref 15–41)
Albumin: 2.3 g/dL — ABNORMAL LOW (ref 3.5–5.0)
Alkaline Phosphatase: 98 U/L (ref 38–126)
Anion gap: 7 (ref 5–15)
BUN: <5 mg/dL — ABNORMAL LOW (ref 6–20)
CO2: 24 mmol/L (ref 22–32)
Calcium: 8.5 mg/dL — ABNORMAL LOW (ref 8.9–10.3)
Chloride: 109 mmol/L (ref 98–111)
Creatinine, Ser: 0.7 mg/dL (ref 0.44–1.00)
GFR, Estimated: >60 mL/min (ref >60)
Glucose, Bld: 83 mg/dL (ref 70–99)
Potassium: 3.8 mmol/L (ref 3.5–5.1)
Sodium: 140 mmol/L (ref 135–145)
Total Bilirubin: 0.8 mg/dL (ref <1.2)
Total Protein: 5.3 g/dL — ABNORMAL LOW (ref 6.5–8.1)

## 2023-01-25 LAB — RPR: RPR Ser Ql: NONREACTIVE

## 2023-01-25 LAB — CBC
HCT: 29.6 % — ABNORMAL LOW (ref 36.0–46.0)
HCT: 25.3 % — ABNORMAL LOW (ref 36.0–46.0)
Hemoglobin: 9.7 g/dL — ABNORMAL LOW (ref 12.0–15.0)
Hemoglobin: 8.6 g/dL — ABNORMAL LOW (ref 12.0–15.0)
MCH: 30.1 pg (ref 26.0–34.0)
MCH: 30.9 pg (ref 26.0–34.0)
MCHC: 32.8 g/dL (ref 30.0–36.0)
MCHC: 34 g/dL (ref 30.0–36.0)
MCV: 91.9 fL (ref 80.0–100.0)
MCV: 91 fL (ref 80.0–100.0)
Platelets: 125 10*3/uL — ABNORMAL LOW (ref 150–400)
Platelets: 131 10*3/uL — ABNORMAL LOW (ref 150–400)
RBC: 3.22 MIL/uL — ABNORMAL LOW (ref 3.87–5.11)
RBC: 2.78 MIL/uL — ABNORMAL LOW (ref 3.87–5.11)
RDW: 12.1 % (ref 11.5–15.5)
RDW: 12.3 % (ref 11.5–15.5)
WBC: 9.9 10*3/uL (ref 4.0–10.5)
WBC: 12.5 10*3/uL — ABNORMAL HIGH (ref 4.0–10.5)
nRBC: 0 % (ref 0.0–0.2)
nRBC: 0 % (ref 0.0–0.2)

## 2023-01-25 LAB — URIC ACID: Uric Acid, Serum: 5.9 mg/dL (ref 2.5–7.1)

## 2023-01-25 LAB — LACTATE DEHYDROGENASE: LDH: 203 U/L — ABNORMAL HIGH (ref 98–192)

## 2023-01-25 LAB — PLATELET COUNT: Platelets: 126 10*3/uL — ABNORMAL LOW (ref 150–400)

## 2023-01-25 MED ORDER — DIBUCAINE (PERIANAL) 1 % EX OINT: 1.0000 | TOPICAL_OINTMENT | CUTANEOUS | Status: DC | PRN

## 2023-01-25 MED ORDER — TRANEXAMIC ACID-NACL 1000-0.7 MG/100ML-% IV SOLN
INTRAVENOUS | Status: AC
  Administered 2023-01-25: 1000 mg via INTRAVENOUS
  Filled 2023-01-25: qty 100

## 2023-01-25 MED ORDER — TRANEXAMIC ACID-NACL 1000-0.7 MG/100ML-% IV SOLN: 1000.0000 mg | INTRAVENOUS | Status: DC

## 2023-01-25 MED ORDER — ONDANSETRON HCL 4 MG PO TABS: 4.0000 mg | ORAL_TABLET | ORAL | Status: DC | PRN

## 2023-01-25 MED ORDER — PRENATAL MULTIVITAMIN CH
1.0000 | ORAL_TABLET | Freq: Every day | ORAL | Status: DC
  Administered 2023-01-25 – 2023-01-27 (×3): 1 via ORAL
  Filled 2023-01-25 (×3): qty 1

## 2023-01-25 MED ORDER — TETANUS-DIPHTH-ACELL PERTUSSIS 5-2.5-18.5 LF-MCG/0.5 IM SUSY
0.5000 mL | PREFILLED_SYRINGE | Freq: Once | INTRAMUSCULAR | Status: DC
  Filled 2023-01-25: qty 0.5

## 2023-01-25 MED ORDER — ONDANSETRON HCL 4 MG/2ML IJ SOLN: 4.0000 mg | INTRAMUSCULAR | Status: DC | PRN

## 2023-01-25 MED ORDER — DIPHENHYDRAMINE HCL 25 MG PO CAPS: 25.0000 mg | ORAL_CAPSULE | Freq: Four times a day (QID) | ORAL | Status: DC | PRN

## 2023-01-25 MED ORDER — OXYCODONE HCL 5 MG PO TABS
5.0000 mg | ORAL_TABLET | ORAL | Status: DC | PRN
Start: 2023-01-25 — End: 2023-01-27

## 2023-01-25 MED ORDER — ACETAMINOPHEN 325 MG PO TABS: 650.0000 mg | ORAL_TABLET | ORAL | Status: DC | PRN

## 2023-01-25 MED ORDER — WITCH HAZEL-GLYCERIN EX PADS: 1.0000 | MEDICATED_PAD | CUTANEOUS | Status: DC | PRN

## 2023-01-25 MED ORDER — IBUPROFEN 600 MG PO TABS
600.0000 mg | ORAL_TABLET | Freq: Four times a day (QID) | ORAL | Status: DC
  Administered 2023-01-25 – 2023-01-27 (×6): 600 mg via ORAL
  Filled 2023-01-25 (×7): qty 1

## 2023-01-25 MED ORDER — COCONUT OIL OIL: 1.0000 | TOPICAL_OIL | Status: DC | PRN

## 2023-01-25 MED ORDER — SENNOSIDES-DOCUSATE SODIUM 8.6-50 MG PO TABS
2.0000 | ORAL_TABLET | Freq: Every day | ORAL | Status: DC
  Administered 2023-01-26 – 2023-01-27 (×2): 2 via ORAL
  Filled 2023-01-25 (×2): qty 2

## 2023-01-25 MED ORDER — SIMETHICONE 80 MG PO CHEW: 80.0000 mg | CHEWABLE_TABLET | ORAL | Status: DC | PRN

## 2023-01-25 MED ORDER — OXYCODONE HCL 5 MG PO TABS: 10.0000 mg | ORAL_TABLET | ORAL | Status: DC | PRN

## 2023-01-25 MED ORDER — BENZOCAINE-MENTHOL 20-0.5 % EX AERO: 1.0000 | INHALATION_SPRAY | CUTANEOUS | Status: DC | PRN

## 2023-01-25 MED ORDER — ZOLPIDEM TARTRATE 5 MG PO TABS: 5.0000 mg | ORAL_TABLET | Freq: Every evening | ORAL | Status: DC | PRN

## 2023-01-25 NOTE — Progress Notes (Signed)
Dr. Su Hilt was informed of patient's elevated bp at 1734 of 142/96. Dr. Su Hilt stated to notified the practice if bp is 160/110 and she place CBC orders for patient.

## 2023-01-25 NOTE — Lactation Note (Signed)
This note was copied from a baby's chart. Lactation Consultation Note  Patient Name: Monique Myers [id number] Date: 01/25/2023 Age:31 hours Reason for consult: Initial assessment;Infant < 6lbs;Primapara;Early term 37-38.6wks;1st time breastfeeding  LC initial rounds. Baby StS with Dad, mom ready to formula feed- baby 5 lb- 8 oz, stable BS of 50. Mom given the option of working with [facility] or formula feeding- mom elects to continue with formula now and call for [facility] assist at a later feed. Discussed progression of infant readiness- sleepy 1st 24 hrs, then more awake with feeding cues, to cluster feeding overnights. Highlighted extended hours of LC help here in hospital, call anytime help desired. Highlighted O/P LC services available and milk storage guidelines of pumped/expressed milk.    Feeding Mother's Current Feeding Choice: Breast Milk and Formula Nipple Type: Nfant Standard Flow (white)  Interventions Interventions: Education;LC Services brochure (Milk Storage Guidelines)  Discharge    Consult Status Consult Status: Follow-up Date: 01/25/23 Follow-up type: In-patient    [facility] 01/25/2023, 1:46 PM

## 2023-01-26 LAB — CBC
HCT: 25.4 % — ABNORMAL LOW (ref 36.0–46.0)
Hemoglobin: 8.5 g/dL — ABNORMAL LOW (ref 12.0–15.0)
MCH: 30.8 pg (ref 26.0–34.0)
MCHC: 33.5 g/dL (ref 30.0–36.0)
MCV: 92 fL (ref 80.0–100.0)
Platelets: 124 10*3/uL — ABNORMAL LOW (ref 150–400)
RBC: 2.76 MIL/uL — ABNORMAL LOW (ref 3.87–5.11)
RDW: 12.6 % (ref 11.5–15.5)
WBC: 10.3 10*3/uL (ref 4.0–10.5)
nRBC: 0 % (ref 0.0–0.2)

## 2023-01-26 LAB — BIRTH TISSUE RECOVERY COLLECTION (PLACENTA DONATION)

## 2023-01-26 MED ORDER — FERROUS SULFATE 325 (65 FE) MG PO TABS
325.0000 mg | ORAL_TABLET | Freq: Two times a day (BID) | ORAL | Status: DC
  Administered 2023-01-27: 325 mg via ORAL
  Filled 2023-01-26: qty 1

## 2023-01-26 NOTE — Progress Notes (Signed)
Post Partum Day1 Subjective: no complaints, voiding and tolerating PO and breast feeding  Objective: Afebrile VSS  Physical Exam:  General: alert Lochia: appropriate Uterine Fundus: firm  DVT Evaluation: No evidence of DVT seen on physical exam.  Recent Labs    01/25/23 2049 01/26/23 0546  HGB 8.6* 8.5*  HCT 25.3* 25.4*    Assessment/Plan: PPD 1 Pt bottle  feeding GHTN BP controllled Iron for anemia Routine care   LOS: 2 days   Monique Myers Alline Pio 01/26/2023, 5:44 PM

## 2023-01-26 NOTE — Plan of Care (Signed)
  Problem: Education: Goal: Knowledge of disease or condition will improve Outcome: Progressing Goal: Knowledge of the prescribed therapeutic regimen will improve Outcome: Progressing   Problem: Fluid Volume: Goal: Peripheral tissue perfusion will improve Outcome: Progressing   Problem: Clinical Measurements: Goal: Complications related to disease process, condition or treatment will be avoided or minimized Outcome: Progressing   Problem: Education: Goal: Knowledge of General Education information will improve Description: Including pain rating scale, medication(s)/side effects and non-pharmacologic comfort measures Outcome: Progressing   Problem: Health Behavior/Discharge Planning: Goal: Ability to manage health-related needs will improve Outcome: Progressing   Problem: Clinical Measurements: Goal: Ability to maintain clinical measurements within normal limits will improve Outcome: Progressing Goal: Will remain free from infection Outcome: Progressing Goal: Diagnostic test results will improve Outcome: Progressing Goal: Respiratory complications will improve Outcome: Progressing Goal: Cardiovascular complication will be avoided Outcome: Progressing   Problem: Activity: Goal: Risk for activity intolerance will decrease Outcome: Progressing   Problem: Nutrition: Goal: Adequate nutrition will be maintained Outcome: Progressing   Problem: Coping: Goal: Level of anxiety will decrease Outcome: Progressing   Problem: Elimination: Goal: Will not experience complications related to bowel motility Outcome: Progressing Goal: Will not experience complications related to urinary retention Outcome: Progressing   Problem: Pain Management: Goal: General experience of comfort will improve Outcome: Progressing   Problem: Safety: Goal: Ability to remain free from injury will improve Outcome: Progressing   Problem: Skin Integrity: Goal: Risk for impaired skin integrity will  decrease Outcome: Progressing   Problem: Education: Goal: Knowledge of Childbirth will improve Outcome: Progressing Goal: Ability to make informed decisions regarding treatment and plan of care will improve Outcome: Progressing Goal: Ability to state and carry out methods to decrease the pain will improve Outcome: Progressing Goal: Individualized Educational Video(s) Outcome: Progressing   Problem: Coping: Goal: Ability to verbalize concerns and feelings about labor and delivery will improve Outcome: Progressing   Problem: Life Cycle: Goal: Ability to make normal progression through stages of labor will improve Outcome: Progressing Goal: Ability to effectively push during vaginal delivery will improve Outcome: Progressing   Problem: Role Relationship: Goal: Will demonstrate positive interactions with the child Outcome: Progressing   Problem: Safety: Goal: Risk of complications during labor and delivery will decrease Outcome: Progressing   Problem: Pain Management: Goal: Relief or control of pain from uterine contractions will improve Outcome: Progressing   Problem: Education: Goal: Knowledge of condition will improve Outcome: Progressing Goal: Individualized Educational Video(s) Outcome: Progressing Goal: Individualized Newborn Educational Video(s) Outcome: Progressing   Problem: Activity: Goal: Will verbalize the importance of balancing activity with adequate rest periods Outcome: Progressing Goal: Ability to tolerate increased activity will improve Outcome: Progressing   Problem: Coping: Goal: Ability to identify and utilize available resources and services will improve Outcome: Progressing   Problem: Life Cycle: Goal: Chance of risk for complications during the postpartum period will decrease Outcome: Progressing   Problem: Role Relationship: Goal: Ability to demonstrate positive interaction with newborn will improve Outcome: Progressing   Problem: Skin  Integrity: Goal: Demonstration of wound healing without infection will improve Outcome: Progressing

## 2023-01-26 NOTE — Lactation Note (Signed)
This note was copied from a baby's chart. Lactation Consultation Note  Patient Name: Monique Myers [id number] Date: 01/26/2023 Age:32 hours Reason for consult: Follow-up assessment;Primapara;1st time breastfeeding;Early term 37-38.6wks;Infant < 6lbs  P1- MOB reports that infant will not latch onto the breast, so she offers formula first and allows him to attempt a latch after. LC reviewed LPI feeding guidelines. MOB verbalizes understanding and reports that she has been following them. MOB has been pumping every 2-3 hrs, but reports little to no colostrum. LC reassured MOB that this is normal and to continue pumping to assist in further stimulation. MOB denies having any questions or concerns at this time.  LC reviewed LPI feeding plan, feeding infant on cue 8-12x in 24 hrs, not allowing infant to go past 3 hrs without a feeding, CDC milk storage guidelines and LC services handout. LC encouraged MOB to call for further assistance as needed.  Maternal Data Does the patient have breastfeeding experience prior to this delivery?: No  Feeding Mother's Current Feeding Choice: Breast Milk and Formula  Lactation Tools Discussed/Used Tools: Pump;Flanges Breast pump type: Double-Electric Breast Pump;Manual Pump Education: Setup, frequency, and cleaning;Milk Storage Reason for Pumping: LPI/ low birth weight Pumping frequency: 15-20 min every 2-3 hrs  Interventions Interventions: Breast feeding basics reviewed;DEBP;Education;Pace feeding;LC Services brochure;LPT handout/interventions  Discharge Discharge Education: Warning signs for feeding baby Pump: DEBP;Personal  Consult Status Consult Status: Follow-up Date: 01/27/23 Follow-up type: In-patient    Dema Severin BS, IBCLC 01/26/2023, 9:41 PM

## 2023-01-27 MED ORDER — IBUPROFEN 600 MG PO TABS: 600.0000 mg | ORAL_TABLET | Freq: Four times a day (QID) | ORAL | 0 refills | Status: AC

## 2023-01-27 NOTE — Discharge Summary (Addendum)
Postpartum Discharge Summary  Patient Name: Monique Myers DOB: 1990-05-30 MRN: [id number]  Date of admission: 01/24/2023 Delivery date:01/25/2023 Delivering provider: Osborn Coho Date of discharge: 01/27/2023  Admitting diagnosis: Elevated blood pressure affecting pregnancy in third trimester, antepartum [O16.3] Gestational HTN [O13.9] Intrauterine pregnancy: [redacted]w[redacted]d     Secondary diagnosis:  Principal Problem:   Elevated blood pressure affecting pregnancy in third trimester, antepartum Active Problems:   Gestational HTN  Additional problems: Iron deficiency anemia.     Discharge diagnosis: Term Pregnancy Delivered                                              Post partum procedures: None.  Augmentation: Pitocin Complications: None  Hospital course: Induction of Labor With Vaginal Delivery   32 y.o. yo G1P1001 at [redacted]w[redacted]d was admitted to the hospital 01/24/2023 for induction of labor.  Indication for induction: Gestational hypertension.  Patient had a normal labor course.  Membrane Rupture Time/Date: 5:04 AM,01/25/2023  Delivery Method:Vaginal, Spontaneous Operative Delivery:N/A Episiotomy: None Lacerations:  Labial Details of delivery can be found in separate delivery note.  Patient had a normal postpartum course.  Patient is discharged home 01/27/23.  Newborn Data: Birth date:01/25/2023 Birth time:9:38 AM Gender:Female Living status:Living Apgars:9 ,9  Weight:2520 g  Immunizations received: There is no immunization history for the selected administration types on file for this patient.  Physical exam  Vitals:   01/26/23 0519 01/26/23 1757 01/26/23 1924 01/27/23 0527  BP: 104/71 (!) 130/91 (!) 135/100 126/89  Pulse: 79 71 72 69  Resp: 16 18 17 16   Temp: 97.8 F (36.6 C) 98.2 F (36.8 C) 99 F (37.2 C) 98.6 F (37 C)  TempSrc:  Oral    SpO2:  100%    Weight:      Height:       General: alert, cooperative, and no distress Lochia: appropriate Uterine Fundus:  firm Incision: N/A DVT Evaluation: No evidence of DVT seen on physical exam. No significant calf/ankle edema. Labs: Lab Results  Component Value Date   WBC 10.3 01/26/2023   HGB 8.5 (L) 01/26/2023   HCT 25.4 (L) 01/26/2023   MCV 92.0 01/26/2023   PLT 124 (L) 01/26/2023      Latest Ref Rng & Units 01/26/2023    5:46 AM 01/25/2023    8:49 PM 01/25/2023    7:35 AM  CBC  WBC 4.0 - 10.5 K/uL 10.3  12.5    Hemoglobin 12.0 - 15.0 g/dL 8.5  8.6    Hematocrit 36.0 - 46.0 % 25.4  25.3    Platelets 150 - 400 K/uL 124  131  126         Latest Ref Rng & Units 01/25/2023    8:49 PM  CMP  Glucose 70 - 99 mg/dL 83   BUN 6 - 20 mg/dL <5   Creatinine 2.44 - 1.00 mg/dL 0.10   Sodium 272 - 536 mmol/L 140   Potassium 3.5 - 5.1 mmol/L 3.8   Chloride 98 - 111 mmol/L 109   CO2 22 - 32 mmol/L 24   Calcium 8.9 - 10.3 mg/dL 8.5   Total Protein 6.5 - 8.1 g/dL 5.3   Total Bilirubin <6.4 mg/dL 0.8   Alkaline Phos 38 - 126 U/L 98   AST 15 - 41 U/L 33   ALT 0 - 44 U/L 14  Edinburgh Score:    01/25/2023   12:10 PM  Edinburgh Postnatal Depression Scale Screening Tool  I have been able to laugh and see the funny side of things. 0  I have looked forward with enjoyment to things. 0  I have blamed myself unnecessarily when things went wrong. 1  I have been anxious or worried for no good reason. 1  I have felt scared or panicky for no good reason. 0  Things have been getting on top of me. 1  I have been so unhappy that I have had difficulty sleeping. 0  I have felt sad or miserable. 0  I have been so unhappy that I have been crying. 0  The thought of harming myself has occurred to me. 0  Edinburgh Postnatal Depression Scale Total 3   No data recorded  After visit meds:  Allergies as of 01/27/2023   No Active Allergies      Medication List     STOP taking these medications    cetirizine 10 MG tablet Commonly known as: ZYRTEC   naproxen 500 MG tablet Commonly known as: NAPROSYN        TAKE these medications    cholecalciferol 25 MCG (1000 UNIT) tablet Commonly known as: VITAMIN D3 Take 1,000 Units by mouth daily.   ferrous sulfate 325 (65 FE) MG tablet Take 325 mg by mouth daily with breakfast.   ibuprofen 600 MG tablet Commonly known as: ADVIL Take 1 tablet (600 mg total) by mouth every 6 (six) hours.   prenatal multivitamin Tabs tablet Take 1 tablet by mouth daily at 12 noon.        Discharge home in stable condition Infant Feeding: Breast Infant Disposition:home with mother Discharge instruction: per After Visit Summary and Postpartum booklet. Activity: Advance as tolerated. Pelvic rest for 6 weeks.  Diet: routine diet Future Appointments:No future appointments. Follow up Visit:  Follow-up Information     Ob/Gyn, [facility]. Schedule an appointment as soon as possible for a visit in 1 week(s).   Specialty: Obstetrics and Gynecology Why: 1 week for blood pressure check. 6 weeks for postpartum visit. Contact information: [address] [place] [address] [phone]               Anticipated Birth Control:  POPs.   01/27/2023 Prescilla Sours, MD

## 2023-01-27 NOTE — Plan of Care (Signed)
  Problem: Education: Goal: Knowledge of disease or condition will improve Outcome: Progressing Goal: Knowledge of the prescribed therapeutic regimen will improve Outcome: Progressing   Problem: Fluid Volume: Goal: Peripheral tissue perfusion will improve Outcome: Progressing   Problem: Clinical Measurements: Goal: Complications related to disease process, condition or treatment will be avoided or minimized Outcome: Progressing   Problem: Education: Goal: Knowledge of General Education information will improve Description: Including pain rating scale, medication(s)/side effects and non-pharmacologic comfort measures Outcome: Progressing   Problem: Health Behavior/Discharge Planning: Goal: Ability to manage health-related needs will improve Outcome: Progressing   Problem: Clinical Measurements: Goal: Ability to maintain clinical measurements within normal limits will improve Outcome: Progressing Goal: Will remain free from infection Outcome: Progressing Goal: Diagnostic test results will improve Outcome: Progressing Goal: Respiratory complications will improve Outcome: Progressing Goal: Cardiovascular complication will be avoided Outcome: Progressing   Problem: Activity: Goal: Risk for activity intolerance will decrease Outcome: Progressing   Problem: Nutrition: Goal: Adequate nutrition will be maintained Outcome: Progressing   Problem: Coping: Goal: Level of anxiety will decrease Outcome: Progressing   Problem: Elimination: Goal: Will not experience complications related to bowel motility Outcome: Progressing Goal: Will not experience complications related to urinary retention Outcome: Progressing   Problem: Pain Management: Goal: General experience of comfort will improve Outcome: Progressing   Problem: Safety: Goal: Ability to remain free from injury will improve Outcome: Progressing   Problem: Skin Integrity: Goal: Risk for impaired skin integrity will  decrease Outcome: Progressing   Problem: Education: Goal: Knowledge of Childbirth will improve Outcome: Progressing Goal: Ability to make informed decisions regarding treatment and plan of care will improve Outcome: Progressing Goal: Ability to state and carry out methods to decrease the pain will improve Outcome: Progressing Goal: Individualized Educational Video(s) Outcome: Progressing   Problem: Coping: Goal: Ability to verbalize concerns and feelings about labor and delivery will improve Outcome: Progressing   Problem: Life Cycle: Goal: Ability to make normal progression through stages of labor will improve Outcome: Progressing Goal: Ability to effectively push during vaginal delivery will improve Outcome: Progressing   Problem: Role Relationship: Goal: Will demonstrate positive interactions with the child Outcome: Progressing   Problem: Safety: Goal: Risk of complications during labor and delivery will decrease Outcome: Progressing   Problem: Pain Management: Goal: Relief or control of pain from uterine contractions will improve Outcome: Progressing   Problem: Education: Goal: Knowledge of condition will improve Outcome: Progressing Goal: Individualized Educational Video(s) Outcome: Progressing Goal: Individualized Newborn Educational Video(s) Outcome: Progressing   Problem: Activity: Goal: Will verbalize the importance of balancing activity with adequate rest periods Outcome: Progressing Goal: Ability to tolerate increased activity will improve Outcome: Progressing   Problem: Coping: Goal: Ability to identify and utilize available resources and services will improve Outcome: Progressing   Problem: Life Cycle: Goal: Chance of risk for complications during the postpartum period will decrease Outcome: Progressing   Problem: Role Relationship: Goal: Ability to demonstrate positive interaction with newborn will improve Outcome: Progressing   Problem: Skin  Integrity: Goal: Demonstration of wound healing without infection will improve Outcome: Progressing

## 2023-01-27 NOTE — Discharge Instructions (Signed)
  Darra, 1. Do not do any heavy lifting, i.e nothing heavier than 15 lbs for the next 6 weeks.  2.  Do not use tampons or douche or take baths, do not have any sexual intercourse or anything inside the vagina for the next 6 weeks.  3. Take your pain medication as needed for pain, let us know if the pain is not well controlled despite pain medication use.  4. Take your iron tablets daily for anemia.  You may also take a stool softener e.g colace if you are constipated.    5.  If you get a fever while at home, do check your temperature and if it is equal to or greater than 100.4 please call the office.   6. Some vaginal bleeding is expected and normal after your delivery. Please let us know if if it excessive where you saturate 1 pad in less than 2 hours or so.  7. Please let us know if with depression or anxiety symptoms, or symptoms of uncontrolled blood pressure such as headache, vision changes, nausea, vomiting, chest pain, shortness of breath.   [facility] [phone].

## 2023-01-27 NOTE — Lactation Note (Signed)
This note was copied from a baby's chart. Lactation Consultation Note  Patient Name: Monique Myers [id number] Date: 01/27/2023 Age:32 years  Reason for consult: 1st time breastfeeding;Primapara;Follow-up assessment;Infant < 6lbs;Early term 37-38.6wks  P1, [redacted]w[redacted]d, 4% weight loss  Mother plans to attempt breastfeeding once home. She has been formula feeding baby due to his weight and calorie needs. Mother has a manual breast pump and instructed to pump every 3 hours for stimulation of milk production. She plans to make an appointment with [facility],   Discussed the process of milk production, "supply and demand" and the importance of breast stimulation and milk removal in order to make an optimal milk supply.  Discussed mother to breastfeed 8-12 times in 24 hours, skin to skin and breast feed before formula feeding.   If missed feedings at breast or substituting feeding with formula, advised to hand express and/or pump to remove milk from the breast.   Mom made aware of O/P services, breastfeeding support groups, community resources, and our phone # for post-discharge questions.       Feeding Mother's Current Feeding Choice: Breast Milk and Formula Nipple Type: Nfant Standard Flow (white)   Interventions Interventions: Education  Discharge Discharge Education: Engorgement and breast care;Warning signs for feeding baby  Consult Status Consult Status: Complete Date: 01/27/23 Follow-up type: In-patient    Christella Hartigan M 01/27/2023, 12:19 PM

## 2023-01-27 NOTE — Plan of Care (Signed)
Problem: Education: Goal: Knowledge of disease or condition will improve 01/27/2023 1055 by Donne Hazel, LPN Outcome: Adequate for Discharge 01/27/2023 0744 by Donne Hazel, LPN Outcome: Progressing Goal: Knowledge of the prescribed therapeutic regimen will improve 01/27/2023 1055 by Donne Hazel, LPN Outcome: Adequate for Discharge 01/27/2023 0744 by Donne Hazel, LPN Outcome: Progressing   Problem: Fluid Volume: Goal: Peripheral tissue perfusion will improve 01/27/2023 1055 by Donne Hazel, LPN Outcome: Adequate for Discharge 01/27/2023 0744 by Donne Hazel, LPN Outcome: Progressing   Problem: Clinical Measurements: Goal: Complications related to disease process, condition or treatment will be avoided or minimized 01/27/2023 1055 by Donne Hazel, LPN Outcome: Adequate for Discharge 01/27/2023 0744 by Donne Hazel, LPN Outcome: Progressing   Problem: Education: Goal: Knowledge of General Education information will improve Description: Including pain rating scale, medication(s)/side effects and non-pharmacologic comfort measures 01/27/2023 1055 by Donne Hazel, LPN Outcome: Adequate for Discharge 01/27/2023 0744 by Donne Hazel, LPN Outcome: Progressing   Problem: Health Behavior/Discharge Planning: Goal: Ability to manage health-related needs will improve 01/27/2023 1055 by Donne Hazel, LPN Outcome: Adequate for Discharge 01/27/2023 0744 by Donne Hazel, LPN Outcome: Progressing   Problem: Clinical Measurements: Goal: Ability to maintain clinical measurements within normal limits will improve 01/27/2023 1055 by Donne Hazel, LPN Outcome: Adequate for Discharge 01/27/2023 0744 by Donne Hazel, LPN Outcome: Progressing Goal: Will remain free from infection 01/27/2023 1055 by Donne Hazel, LPN Outcome: Adequate for Discharge 01/27/2023 0744 by Donne Hazel, LPN Outcome: Progressing Goal: Diagnostic test results will improve 01/27/2023 1055 by  Donne Hazel, LPN Outcome: Adequate for Discharge 01/27/2023 0744 by Donne Hazel, LPN Outcome: Progressing Goal: Respiratory complications will improve 01/27/2023 1055 by Donne Hazel, LPN Outcome: Adequate for Discharge 01/27/2023 0744 by Donne Hazel, LPN Outcome: Progressing Goal: Cardiovascular complication will be avoided 01/27/2023 1055 by Donne Hazel, LPN Outcome: Adequate for Discharge 01/27/2023 0744 by Donne Hazel, LPN Outcome: Progressing   Problem: Activity: Goal: Risk for activity intolerance will decrease 01/27/2023 1055 by Donne Hazel, LPN Outcome: Adequate for Discharge 01/27/2023 0744 by Donne Hazel, LPN Outcome: Progressing   Problem: Nutrition: Goal: Adequate nutrition will be maintained 01/27/2023 1055 by Donne Hazel, LPN Outcome: Adequate for Discharge 01/27/2023 0744 by Donne Hazel, LPN Outcome: Progressing   Problem: Coping: Goal: Level of anxiety will decrease 01/27/2023 1055 by Donne Hazel, LPN Outcome: Adequate for Discharge 01/27/2023 0744 by Donne Hazel, LPN Outcome: Progressing   Problem: Elimination: Goal: Will not experience complications related to bowel motility 01/27/2023 1055 by Donne Hazel, LPN Outcome: Adequate for Discharge 01/27/2023 0744 by Donne Hazel, LPN Outcome: Progressing Goal: Will not experience complications related to urinary retention 01/27/2023 1055 by Donne Hazel, LPN Outcome: Adequate for Discharge 01/27/2023 0744 by Donne Hazel, LPN Outcome: Progressing   Problem: Pain Management: Goal: General experience of comfort will improve 01/27/2023 1055 by Donne Hazel, LPN Outcome: Adequate for Discharge 01/27/2023 0744 by Donne Hazel, LPN Outcome: Progressing   Problem: Safety: Goal: Ability to remain free from injury will improve 01/27/2023 1055 by Donne Hazel, LPN Outcome: Adequate for Discharge 01/27/2023 0744 by Donne Hazel, LPN Outcome: Progressing   Problem: Skin  Integrity: Goal: Risk for impaired skin integrity will decrease 01/27/2023 1055 by Donne Hazel, LPN Outcome: Adequate for Discharge 01/27/2023 0744 by Donne Hazel, LPN Outcome: Progressing  Problem: Education: Goal: Knowledge of Childbirth will improve 01/27/2023 1055 by Donne Hazel, LPN Outcome: Adequate for Discharge 01/27/2023 0744 by Donne Hazel, LPN Outcome: Progressing Goal: Ability to make informed decisions regarding treatment and plan of care will improve 01/27/2023 1055 by Donne Hazel, LPN Outcome: Adequate for Discharge 01/27/2023 0744 by Donne Hazel, LPN Outcome: Progressing Goal: Ability to state and carry out methods to decrease the pain will improve 01/27/2023 1055 by Donne Hazel, LPN Outcome: Adequate for Discharge 01/27/2023 0744 by Donne Hazel, LPN Outcome: Progressing Goal: Individualized Educational Video(s) 01/27/2023 1055 by Donne Hazel, LPN Outcome: Adequate for Discharge 01/27/2023 0744 by Donne Hazel, LPN Outcome: Progressing   Problem: Coping: Goal: Ability to verbalize concerns and feelings about labor and delivery will improve 01/27/2023 1055 by Donne Hazel, LPN Outcome: Adequate for Discharge 01/27/2023 0744 by Donne Hazel, LPN Outcome: Progressing   Problem: Life Cycle: Goal: Ability to make normal progression through stages of labor will improve 01/27/2023 1055 by Donne Hazel, LPN Outcome: Adequate for Discharge 01/27/2023 0744 by Donne Hazel, LPN Outcome: Progressing Goal: Ability to effectively push during vaginal delivery will improve 01/27/2023 1055 by Donne Hazel, LPN Outcome: Adequate for Discharge 01/27/2023 0744 by Donne Hazel, LPN Outcome: Progressing   Problem: Role Relationship: Goal: Will demonstrate positive interactions with the child 01/27/2023 1055 by Donne Hazel, LPN Outcome: Adequate for Discharge 01/27/2023 0744 by Donne Hazel, LPN Outcome: Progressing   Problem:  Safety: Goal: Risk of complications during labor and delivery will decrease 01/27/2023 1055 by Donne Hazel, LPN Outcome: Adequate for Discharge 01/27/2023 0744 by Donne Hazel, LPN Outcome: Progressing   Problem: Pain Management: Goal: Relief or control of pain from uterine contractions will improve 01/27/2023 1055 by Donne Hazel, LPN Outcome: Adequate for Discharge 01/27/2023 0744 by Donne Hazel, LPN Outcome: Progressing   Problem: Education: Goal: Knowledge of condition will improve 01/27/2023 1055 by Donne Hazel, LPN Outcome: Adequate for Discharge 01/27/2023 0744 by Donne Hazel, LPN Outcome: Progressing Goal: Individualized Educational Video(s) 01/27/2023 1055 by Donne Hazel, LPN Outcome: Adequate for Discharge 01/27/2023 0744 by Donne Hazel, LPN Outcome: Progressing Goal: Individualized Newborn Educational Video(s) 01/27/2023 1055 by Donne Hazel, LPN Outcome: Adequate for Discharge 01/27/2023 0744 by Donne Hazel, LPN Outcome: Progressing   Problem: Activity: Goal: Will verbalize the importance of balancing activity with adequate rest periods 01/27/2023 1055 by Donne Hazel, LPN Outcome: Adequate for Discharge 01/27/2023 0744 by Donne Hazel, LPN Outcome: Progressing Goal: Ability to tolerate increased activity will improve 01/27/2023 1055 by Donne Hazel, LPN Outcome: Adequate for Discharge 01/27/2023 0744 by Donne Hazel, LPN Outcome: Progressing   Problem: Coping: Goal: Ability to identify and utilize available resources and services will improve 01/27/2023 1055 by Donne Hazel, LPN Outcome: Adequate for Discharge 01/27/2023 0744 by Donne Hazel, LPN Outcome: Progressing   Problem: Life Cycle: Goal: Chance of risk for complications during the postpartum period will decrease 01/27/2023 1055 by Donne Hazel, LPN Outcome: Adequate for Discharge 01/27/2023 0744 by Donne Hazel, LPN Outcome: Progressing   Problem: Role  Relationship: Goal: Ability to demonstrate positive interaction with newborn will improve 01/27/2023 1055 by Donne Hazel, LPN Outcome: Adequate for Discharge 01/27/2023 0744 by Donne Hazel, LPN Outcome: Progressing   Problem: Skin Integrity: Goal: Demonstration of wound healing without infection will improve 01/27/2023 1055 by Donne Hazel,  LPN Outcome: Adequate for Discharge 01/27/2023 0744 by Donne Hazel, LPN Outcome: Progressing

## 2023-02-04 ENCOUNTER — Inpatient Hospital Stay (HOSPITAL_COMMUNITY): Admit: 2023-02-04 | Payer: Medicaid Other

## 2023-02-05 ENCOUNTER — Telehealth (HOSPITAL_COMMUNITY): Payer: Self-pay

## 2023-02-05 NOTE — Telephone Encounter (Signed)
02/05/2023 0949  Name: Monique Myers MRN: [id number] DOB: Jun 29, 1990  Reason for Call:  Transition of Care Hospital Discharge Call  Contact Status: Patient Contact Status: Complete  Language assistant needed:          Follow-Up Questions: Do You Have Any Concerns About Your Health As You Heal From Delivery?: Yes What Concerns Do You Have About Your Health?: Patient asks about how long it take for perineum to heal and stitches to disolve. RN reviewed perineum healing process. No other questions or concerns noted. Do You Have Any Concerns About Your Infants Health?: No  Edinburgh Postnatal Depression Scale:  In the Past 7 Days: I have been able to laugh and see the funny side of things.: As much as I always could I have looked forward with enjoyment to things.: As much as I ever did I have blamed myself unnecessarily when things went wrong.: Not very often I have been anxious or worried for no good reason.: No, not at all I have felt scared or panicky for no good reason.: No, not much Things have been getting on top of me.: No, I have been coping as well as ever I have been so unhappy that I have had difficulty sleeping.: Not at all I have felt sad or miserable.: No, not at all I have been so unhappy that I have been crying.: No, never The thought of harming myself has occurred to me.: Never Edinburgh Postnatal Depression Scale Total: 2  PHQ2-9 Depression Scale:     Discharge Follow-up: Edinburgh score requires follow up?: No Patient was advised of the following resources:: Breastfeeding Support Group, Support Group  Post-discharge interventions: NA  Signature  Signe Colt
